# Patient Record
Sex: Female | Born: 1965 | Race: Black or African American | Hispanic: No | Marital: Single | Smoking: Never smoker
Health system: Southern US, Community
[De-identification: ages and names within clinical notes are randomized; demographics above are authoritative.]

## PROBLEM LIST (undated history)

## (undated) DIAGNOSIS — I10 Essential (primary) hypertension: Secondary | ICD-10-CM

## (undated) HISTORY — PX: CYST EXCISION: SHX5701

---

## 2007-06-13 ENCOUNTER — Inpatient Hospital Stay (HOSPITAL_COMMUNITY): Admission: AD | Admit: 2007-06-13 | Discharge: 2007-06-16 | Payer: Self-pay | Admitting: Obstetrics and Gynecology

## 2007-06-14 ENCOUNTER — Encounter (INDEPENDENT_AMBULATORY_CARE_PROVIDER_SITE_OTHER): Payer: Self-pay | Admitting: Obstetrics and Gynecology

## 2009-05-10 IMAGING — CT CT ABDOMEN W/ CM
2 of 5 series · 16 of 46 positions shown, 18 images · IV contrast (agent unspecified)
Comparison: Some done earlier the same date.

CT ABDOMEN

CLINICAL DATA: Right lower quadrant abdominal pain and vomiting.
Abnormal pelvic ultrasound.

CT ABDOMEN AND PELVIS WITHOUT AND WITH CONTRAST
TECHNIQUE: Multidetector CT imaging of the abdomen and pelvis was
performed following the standard protocol before and following the
bolus administration of intravenous contrast.
Contrast: 100 ml 6mnipaque-ACC intravenously.  Oral contrast was
given.

[Series 2: abd/pelv with 5.0 b31f st · axial · 0.66mm/px · z∈[-502,-142]mm · 13 of 82 slices shown, 15 images]
[im 5/82  soft-tissue]
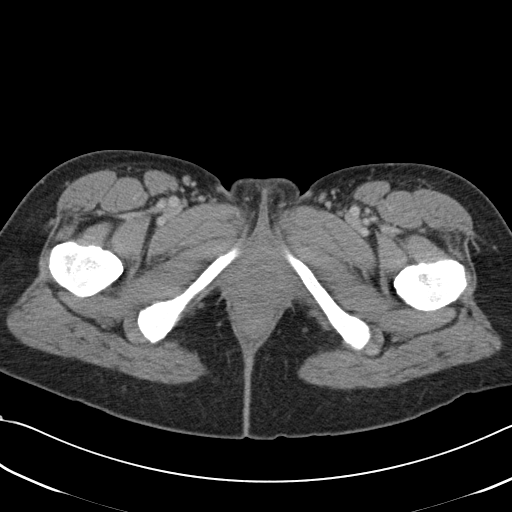
[im 5/82  bone]
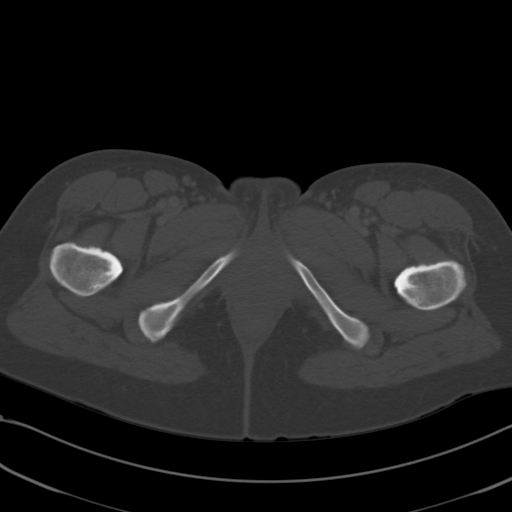
[im 13/82  soft-tissue]
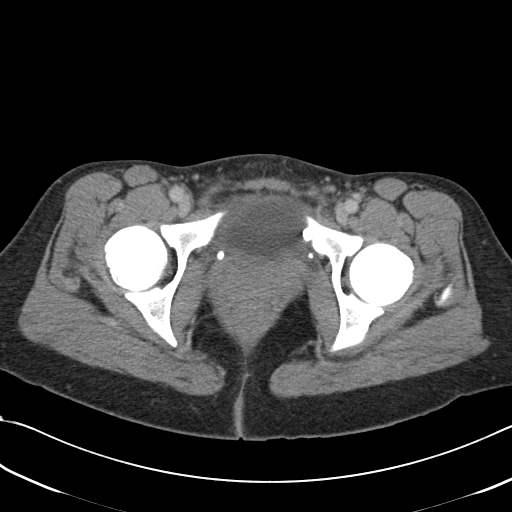
[im 17/82  soft-tissue]
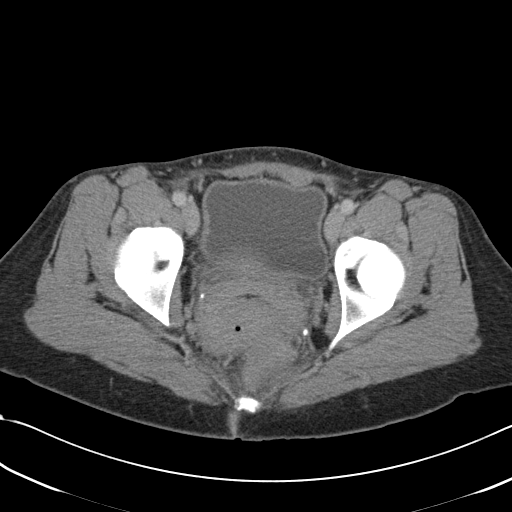
[im 25/82  soft-tissue]
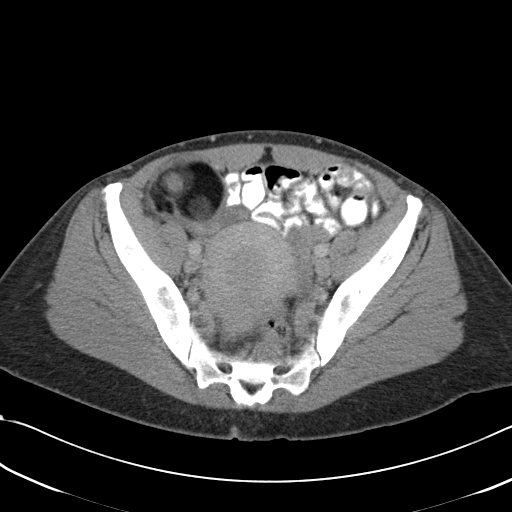
[im 29/82  soft-tissue]
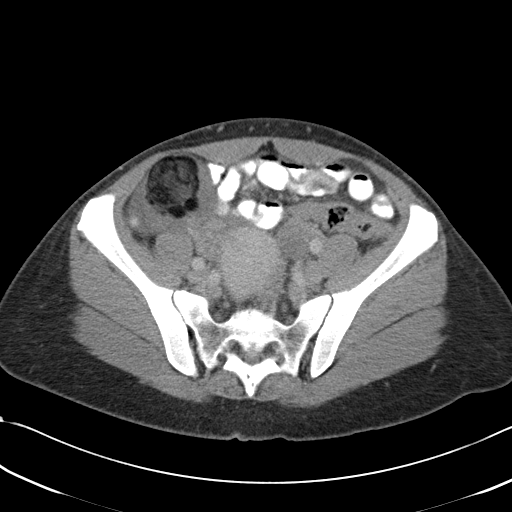
[im 37/82  soft-tissue]
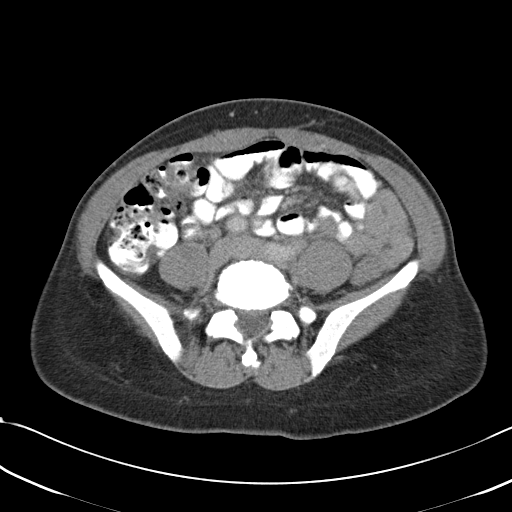
[im 41/82  soft-tissue]
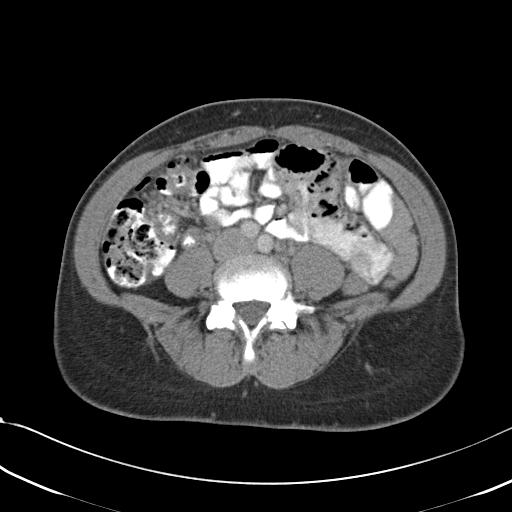
[im 45/82  soft-tissue]
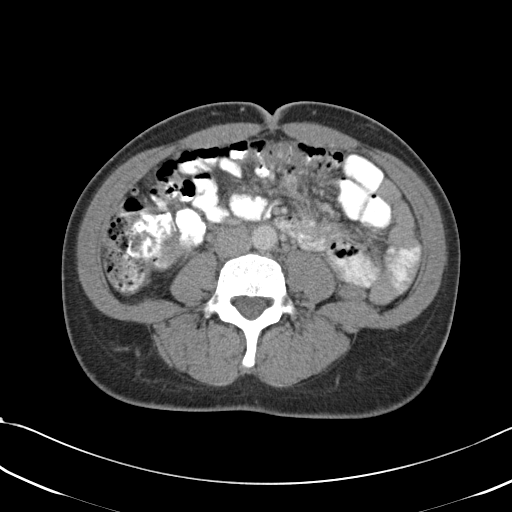
[im 53/82  soft-tissue]
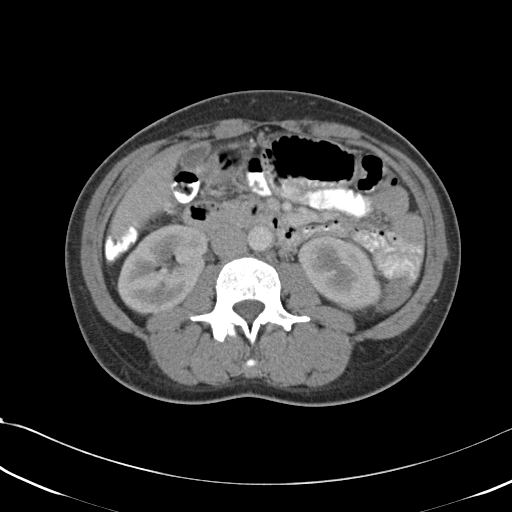
[im 53/82  bone]
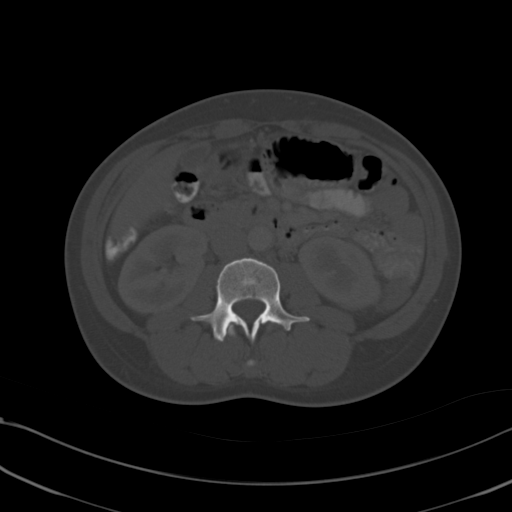
[im 57/82  soft-tissue]
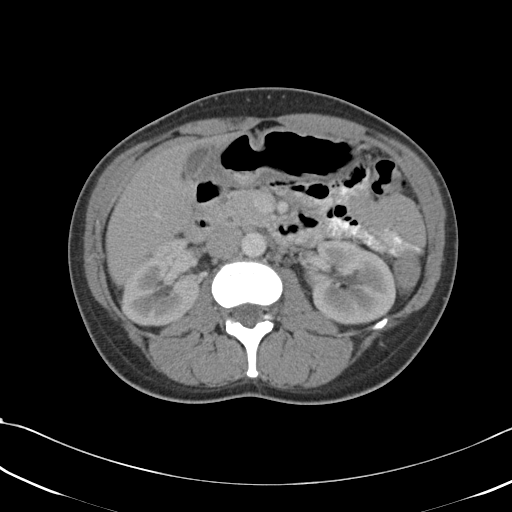
[im 65/82  soft-tissue]
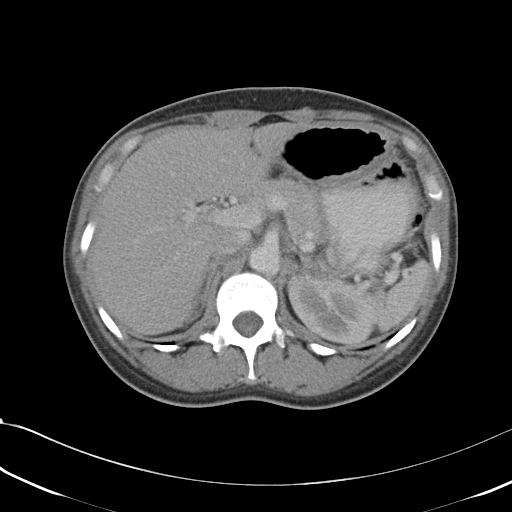
[im 69/82  soft-tissue]
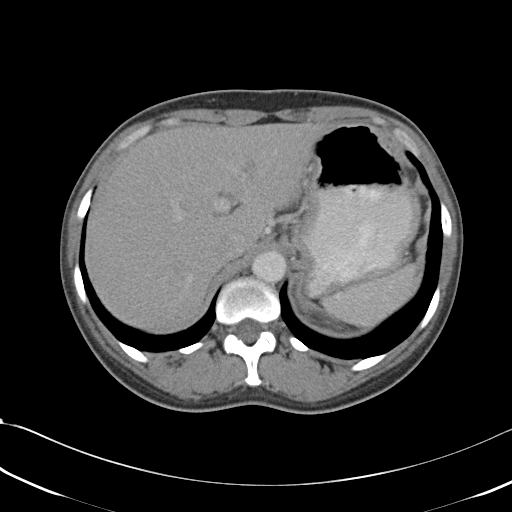
[im 77/82  soft-tissue]
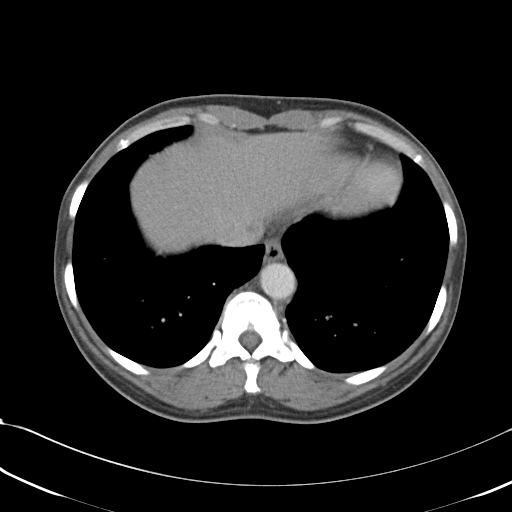

[Series 602: cor · coronal · 0.80mm/px · 3 of 51 slices shown]
[im 17/51  soft-tissue]
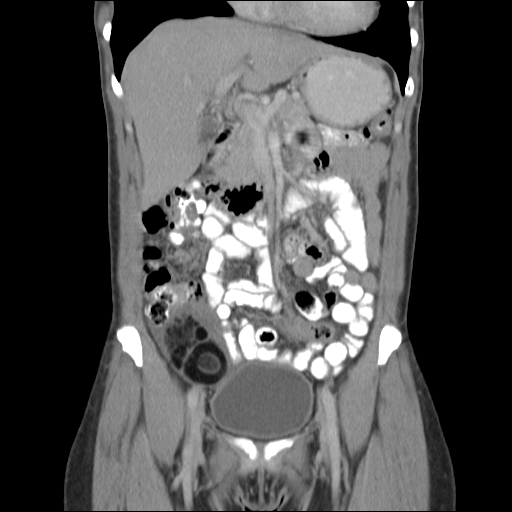
[im 23/51  soft-tissue]
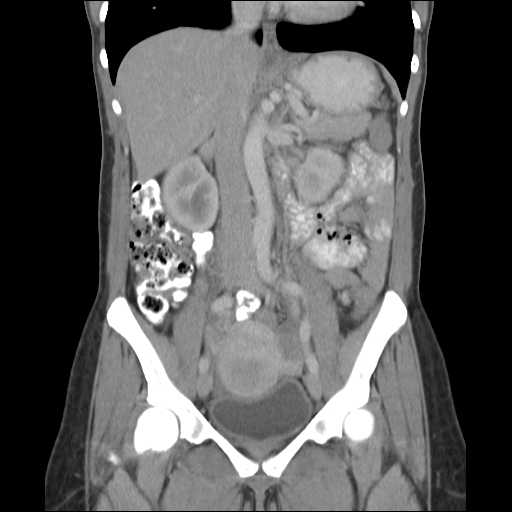
[im 28/51  soft-tissue]
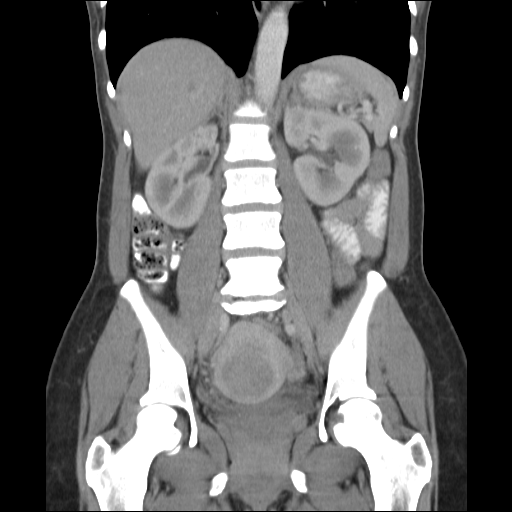

[16 of 46 positions shown; findings below may reference images not displayed]

FINDINGS: The lung bases are clear.  There is no pleural effusion.
The liver, spleen, gallbladder, pancreas, adrenal glands and
kidneys appear normal aside from a probable 6 mm cyst posteriorly
in the mid left kidney.  Gallbladder assessment is limited by
contraction.  No intra-abdominal inflammatory change are
identified.  The bowel gas pattern is normal.
IMPRESSION: No acute abdominal findings.

CT PELVIS
FINDINGS: Corresponding with the heterogeneous mass noted in the
false pelvis on the prior ultrasound is a fatty right adnexal mass
measuring 5.3 x 4.1 cm transverse.  This measures up to 6.4 cm in
diameter on the coronal images.  There are central soft tissue
components, but no calcifications.  There is some soft tissue
stranding in the surrounding fat, and given the patient's acute
symptoms, the possibility of torsion of this ovarian dermoid would
need to be considered.

The appendix is visualized anteriorly and superiorly, well removed
from this process.  The appendix appears normal.

The left ovary appears normal.  Anterior uterine fibroid is noted.
Small phleboliths are noted bilaterally.
IMPRESSION: 1.  Right ovarian dermoid (mature teratoma) measuring up to 6.4 cm
in diameter.  There is mild surrounding soft tissue stranding, and
given the patient's acute symptoms, the possibility of torsion
definitely needs to be considered.  Gynecologic consultation is
recommended.
2.  No evidence of appendicitis.
3.  Uterine fibroid.
4. I discussed the findings by telephone with Dr Zarzour on 06/13/2007
at 2501 hours.

## 2010-02-11 ENCOUNTER — Encounter: Payer: Self-pay | Admitting: Emergency Medicine

## 2010-06-05 NOTE — Op Note (Signed)
[name], [name]                 ACCOUNT NO.:  [id number]   MEDICAL RECORD NO.:  [id number]          PATIENT TYPE:  MAT   LOCATION:  [facility]                          FACILITY:  [facility]   PHYSICIAN:  [name] H. Tenny Craw, MD     DATE OF BIRTH:  August 05, 1965   DATE OF PROCEDURE:  06/14/2007  DATE OF DISCHARGE:                               OPERATIVE REPORT   PREOPERATIVE DIAGNOSES:  1. Right adnexal mass.  2. Suspected right adnexal torsion.   POSTOPERATIVE DIAGNOSES:  1. Right adnexal mass, grossly consistent with ovarian dermoid.  2. No obvious torsion.   PROCEDURES:  1. Exploratory laparotomy.  2. Right salpingo-oophorectomy.   SURGEON:  Freddrick March. Tenny Craw, MD   ASSISTANT:  None.   ANESTHESIA:  General.   OPERATIVE FINDINGS:  Enlarged right ovary approximately 5-6 cm in size.  No evidence of torsion of the ovary was noted at the time of surgery.  Uterus was 8-Ostrosky size with anterior fundal fibroid, approximately 3-  4cm, noted and normal-appearing left ovary and tube.  Small and large  bowel appeared normal and normal appendix.  The right ovary was bisected  after removal and fat and hair were noted within the mass consistent  with dermoid.   SPECIMENS:  Right tube and ovary and pelvic washings.   DISPOSITION:  Specimens to pathology.   ESTIMATED BLOOD LOSS:  200 mL.   COMPLICATIONS:  None.   PROCEDURE:  [name] is a 45 year old G0, who presented to the  emergency room on Jun 13, 2007, complaining of acute onset of right  lower quadrant sharp abdominal pain associated with nausea.  In the  emergency room, she had a pelvic ultrasound performed, which  demonstrated approximately 8-Colclough size uterus with an anterior fundal  fibroid, normal-appearing left ovary; however, the right ovary could not  be visualized on the pelvic portion of the ultrasound.  On the abdominal  portion in the right lower quadrant, there appeared to be approximately  6-cm sized mass with cystic and solid  components.  No obvious Doppler  flow was noted in this mass.  It was unable to be determined whether  this was the adnexa or the appendix and a CT scan was performed, which  demonstrated a normal-appearing appendix, and enlarged right adnexa with  cystic and solid components.  On CT consistent with a right adnexal  dermoid.  Given the patient's pain, there was concern for right adnexal  torsion.  In the emergency room at [facility], the patient was  without pain; however, she had received Dilaudid prior to transport from  [facility] and given the previous findings, the decision was made to  proceed with definitive surgical management for suspected right dermoid  with torsion.  Risks, benefits, and alternatives of the procedure were  discussed with the patient including possible malignancy, possible need  for total abdominal hysterectomy.  The patient understood the risks and  agreed to proceed with the procedure.  Following the appropriate  informed consent, the patient was brought to the operating room where  general anesthesia was administered.  She  was placed in the dorsal  supine position, prepped and draped in the normal sterile fashion.  A  Pfannenstiel skin incision was made with a scalpel and carried down  through the underlying layers of soft tissue to the fascia.  The fascia  was incised in the midline.  The fascial incision was extended laterally  with the Mayo scissors.  Superior aspect of the fascial incision was  grasped with Kocher clamps x2 tented up.  Underlying rectus muscle was  dissected off sharply with the electrocautery unit.  The same procedure  was repeated on the inferior aspect of the fascial incision.  The rectus  muscles were then separated in the midline.  The abdominal peritoneum  was identified, tented up, entered sharply with the Metzenbaum.  This  incision was extended superiorly and inferiorly with good visualization  of the bladder.  The abdomen  and pelvis was then manually explored.  The  uterus was palpably normal except for a palpable fibroid.  The left  adnexa was palpably normal.  The right adnexa was palpably enlarged.  The bowels were palpably normal.  There was no peritoneal studding  noted.  Pelvic washings were obtained at this point.  The Balfour  retractor was then erected.  The large and small bowel were then passed  into the upper abdomen with moist laparotomy sponges.  The bladder blade  was then inserted.  The right adnexa was identified.  The peritoneum  lateral to the infundibulopelvic ligament was then identified, tented  up, and entered sharply with the electrocautery unit.  The incision was  extended superior and lateral to the infundibulopelvic ligament and also  inferiorly and the retroperitoneal space was opened.  The ureter was  identified in the medial leaf of the broad ligament and noted to  peristalsis.  At this point, a window was made in the medial leaf of the  broad ligament inferior to the infundibulopelvic ligament and superior  to the ureter.  Heaney clamps were then used to clamp the  infundibulopelvic ligament x2.  The IP was then cut, free tied, and  suture ligated.  The incision in the medial leaf of the broad ligament  was then further extended inferior to the infundibulopelvic ligament  down to the uterus.  A second Heaney clamp was clamped across the  junction of the fallopian tube and ovarian suspensory ligament and the  uterus a second bowel clamp was placed.  The Mayo scissors were then  used to transect the ovary and tube from its attachment to the uterus  and at this point, the right ovary and tube were removed from the  abdomen.  The pedicle was then free tied and suture ligated and noted to  be hemostatic.  At this point, the infundibulopelvic ligament was  reinspected, and found to be hemostatic.  The uterine pedicle was  inspected and found to be hemostatic.  The ureter was  reinspected on the  medial leaf of the broad ligament and found to peristalsis.  At this  point, the moist laparotomy sponges removed from the upper abdomen.  The  Balfour retractor was removed from the abdominal cavity.  The appendix  was then identified at this time and visualized to be normal.  The  bowels were then returned to the abdominal cavity.  The abdominal  peritoneum was reapproximated with 2-0 Vicryl in a running fashion.  The  rectus muscles were reapproximated with 2-0 Vicryl with two figure-of-8  sutures.  The fascia was  closed with 0 looped PDS in a running fashion  and the skin was closed with staples.  All sponge, lap, and needle  counts were correct x2.  The patient tolerated the procedure well and  was brought to the recovery room in stable condition following the  procedure.      Freddrick March. Tenny Craw, MD  Electronically Signed     KHR/MEDQ  D:  06/14/2007  T:  06/14/2007  Job:  [id number]

## 2010-06-08 NOTE — Discharge Summary (Signed)
[name] [name], [name] [name]                 ACCOUNT NO.:  [id number]   MEDICAL RECORD NO.:  [id number]          PATIENT TYPE:  INP   LOCATION:  9302                          FACILITY:  [facility]   PHYSICIAN:  Kendra H. Tenny Craw, MD     DATE OF BIRTH:  May 02, 1965   DATE OF ADMISSION:  06/13/2007  DATE OF DISCHARGE:  06/16/2007                               DISCHARGE SUMMARY   HOSPITAL DIAGNOSES:  1. Right ovarian torsion.  2. Hypertensive urgency.   HOSPITAL COURSE:  [name] is a 45 year old African American female who  presented to the emergency room on Jun 13, 2007 with acute onset of  right lower quadrant pain.  A CT scan of the abdomen and pelvis  demonstrated a right adnexal mass consistent with a dermoid or mature  cystic teratoma.  She was noted to be hypertensive at that time, however  this was attributed to her discomfort.  Later on that day, she was  transferred to [facility] and underwent an exploratory laparotomy  with a right salpingo-oophorectomy for a right mature cystic teratoma.  No evidence of torsion could be found at the time of surgery.  Postoperatively, because of significant hypertension requiring IV  antihypertensives to control her blood pressures.  The patient was  admitted to the AICU.  On postoperative day zero, the patient was  changed to oral labetalol.  IV labetalol was stopped.  She was  transferred to the regular postoperative floor and on postoperative day  #2, she was felt stable for discharge after having adequate control of  pain and blood pressure, ambulating well and tolerating a regular diet.  She was advised to follow up for postoperative evaluation 2 Mastandrea after  discharged and I was also advised to obtain a primary care physician for  management of hypertension.      Freddrick March. Tenny Craw, MD  Electronically Signed     KHR/MEDQ  D:  10/04/2007  T:  10/05/2007  Job:  [id number]

## 2010-10-17 LAB — CBC
HCT: 33.1 — ABNORMAL LOW
HCT: 37.4
Hemoglobin: 11.5 — ABNORMAL LOW
Hemoglobin: 12.5
MCHC: 33.5
MCHC: 34.8
MCV: 89.9
MCV: 90.1
Platelets: 215
Platelets: 239
RBC: 3.68 — ABNORMAL LOW
RBC: 4.15
RDW: 14.4
RDW: 14.4
WBC: 13.6 — ABNORMAL HIGH
WBC: 13.6 — ABNORMAL HIGH

## 2010-10-17 LAB — BASIC METABOLIC PANEL
BUN: 8
CO2: 23
Calcium: 8.9
Chloride: 106
Creatinine, Ser: 0.81
GFR calc Af Amer: >60
GFR calc non Af Amer: >60
Glucose, Bld: 162 — ABNORMAL HIGH
Potassium: 4
Sodium: 135

## 2010-10-17 LAB — URINALYSIS, ROUTINE W REFLEX MICROSCOPIC
Hgb urine dipstick: NEGATIVE
Nitrite: NEGATIVE
Protein, ur: NEGATIVE
Specific Gravity, Urine: 1.01
Urobilinogen, UA: 0.2

## 2010-10-17 LAB — GC/CHLAMYDIA PROBE AMP, GENITAL
Chlamydia, DNA Probe: NEGATIVE
GC Probe Amp, Genital: NEGATIVE

## 2010-10-17 LAB — DIFFERENTIAL
Basophils Absolute: 0
Basophils Relative: 0
Eosinophils Absolute: 0
Eosinophils Relative: 0
Lymphocytes Relative: 13
Lymphs Abs: 1.7
Monocytes Absolute: 0.7
Monocytes Relative: 5
Neutro Abs: 11.1 — ABNORMAL HIGH
Neutrophils Relative %: 81 — ABNORMAL HIGH

## 2010-10-17 LAB — COMPREHENSIVE METABOLIC PANEL
Albumin: 3.8
BUN: 12
Calcium: 9
Glucose, Bld: 118 — ABNORMAL HIGH
Total Protein: 7.5

## 2010-10-17 LAB — TYPE AND SCREEN
ABO/RH(D): O POS
Antibody Screen: NEGATIVE

## 2010-10-17 LAB — PREGNANCY, URINE: Preg Test, Ur: NEGATIVE

## 2010-10-17 LAB — PROTIME-INR
INR: 1.1
Prothrombin Time: 14.1

## 2010-10-17 LAB — WET PREP, GENITAL
Trich, Wet Prep: NONE SEEN
Yeast Wet Prep HPF POC: NONE SEEN

## 2010-10-17 LAB — LIPASE, BLOOD: Lipase: 15

## 2010-10-17 LAB — ABO/RH: ABO/RH(D): O POS

## 2010-10-17 LAB — FIBRINOGEN: Fibrinogen: 329

## 2010-10-17 LAB — APTT: aPTT: 34

## 2015-05-19 ENCOUNTER — Encounter (HOSPITAL_COMMUNITY): Payer: Self-pay | Admitting: Emergency Medicine

## 2015-05-19 ENCOUNTER — Ambulatory Visit (HOSPITAL_COMMUNITY)
Admission: EM | Admit: 2015-05-19 | Discharge: 2015-05-19 | Disposition: A | Discharge status: Home / Self Care | Payer: Self-pay | Attending: Internal Medicine | Admitting: Internal Medicine

## 2015-05-19 DIAGNOSIS — B029 Zoster without complications: Secondary | ICD-10-CM

## 2015-05-19 HISTORY — DX: Essential (primary) hypertension: I10

## 2015-05-19 MED ORDER — ACYCLOVIR 800 MG PO TABS: 800.0000 mg | ORAL_TABLET | Freq: Every day | ORAL | Status: AC

## 2015-05-19 MED ORDER — PREDNISONE 50 MG PO TABS: 50.0000 mg | ORAL_TABLET | Freq: Every day | ORAL | Status: AC

## 2015-05-19 MED ORDER — TRAMADOL HCL 50 MG PO TABS: 50.0000 mg | ORAL_TABLET | Freq: Four times a day (QID) | ORAL | Status: AC | PRN

## 2015-05-19 NOTE — Discharge Instructions (Signed)
Prescriptions for acyclovir (for shingles), prednisone (to help with pain), tramadol (for pain).  Recheck for marked worsening of pain.  Anticipate gradual improvement in rash/pain over the next 2-3 Oliveria.    Shingles Shingles is an infection that causes a painful skin rash and fluid-filled blisters. Shingles is caused by the same virus that causes chickenpox. Shingles only develops in people who:  Have had chickenpox.  Have gotten the chickenpox vaccine. (This is rare.) The first symptoms of shingles may be itching, tingling, or pain in an area on your skin. A rash will follow in a few days or Gasser. The rash is usually on one side of the body in a bandlike or beltlike pattern. Over time, the rash turns into fluid-filled blisters that break open, scab over, and dry up. Medicines may:  Help you manage pain.  Help you recover more quickly.  Help to prevent long-term problems. HOME CARE Medicines  Take medicines only as told by your doctor.  Apply an anti-itch or numbing cream to the affected area as told by your doctor. Blister and Rash Care  Take a cool bath or put cool compresses on the area of the rash or blisters as told by your doctor. This may help with pain and itching.  Keep your rash covered with a loose bandage (dressing). Wear loose-fitting clothing.  Keep your rash and blisters clean with mild soap and cool water or as told by your doctor.  Check your rash every day for signs of infection. These include redness, swelling, and pain that lasts or gets worse.  Do not pick your blisters.  Do not scratch your rash. General Instructions  Rest as told by your doctor.  Keep all follow-up visits as told by your doctor. This is important.  Until your blisters scab over, your infection can cause chickenpox in people who have never had it or been vaccinated against it. To prevent this from happening, avoid touching other people or being around other people,  especially:  Babies.  Pregnant women.  Children who have eczema.  Elderly people who have transplants.  People who have chronic illnesses, such as leukemia or AIDS. GET HELP IF:  Your pain does not get better with medicine.  Your pain does not get better after the rash heals.  Your rash looks infected. Signs of infection include:  Redness.  Swelling.  Pain that lasts or gets worse. GET HELP RIGHT AWAY IF:  The rash is on your face or nose.  You have pain in your face, pain around your eye area, or loss of feeling on one side of your face.  You have ear pain or you have ringing in your ear.  You have loss of taste.  Your condition gets worse.   This information is not intended to replace advice given to you by your health care provider. Make sure you discuss any questions you have with your health care provider.   Document Released: 06/26/2007 Document Revised: 01/28/2014 Document Reviewed: 10/19/2013 Elsevier Interactive Patient Education [employer]2016 [employer] Inc.

## 2015-05-19 NOTE — ED Notes (Signed)
Patient reports back pain started Sunday 05/14/15.  Patient reports rash appeared 3 days ago. Patient has a rash around left torso, bra-line

## 2015-05-19 NOTE — ED Provider Notes (Signed)
CSN: [id number]     Arrival date & time 05/19/15  1309 History   First MD Initiated Contact with Patient 05/19/15 1355     Chief Complaint  Patient presents with  . Rash  . Back Pain   HPI   50yo lady with 3d hx sore red rash L mid back/L breast and chest.  Pain in L back/chest started about a week ago. Malaise.  Taking tylenol with some relief.  No fever. No cough.  Past Medical History  Diagnosis Date  . Hypertension    Past Surgical History  Procedure Laterality Date  . Cyst excision      Social History  Substance Use Topics  . Smoking status: Never Smoker   . Smokeless tobacco: None  . Alcohol Use: No    Review of Systems  All other systems reviewed and are negative.   Allergies  Review of patient's allergies indicates no known allergies.  Home Medications   Prior to Admission medications   Medication Sig Start Date End Date Taking? Authorizing Provider  acetaminophen (TYLENOL) 325 MG tablet Take 650 mg by mouth every 6 (six) hours as needed.   Yes Historical Provider, MD  acyclovir (ZOVIRAX) 800 MG tablet Take 1 tablet (800 mg total) by mouth 5 (five) times daily. 05/19/15 05/28/15  Eustace Moore, MD  predniSONE (DELTASONE) 50 MG tablet Take 1 tablet (50 mg total) by mouth daily. 05/19/15 05/24/15  Eustace Moore, MD  traMADol (ULTRAM) 50 MG tablet Take 1 tablet (50 mg total) by mouth every 6 (six) hours as needed. 05/19/15   Eustace Moore, MD      BP 147/96 mmHg  Pulse 81  Temp(Src) 98.6 F (37 C) (Oral)  Resp 22  SpO2 100%  LMP 04/21/2015  Physical Exam  Constitutional: She is oriented to person, place, and time. No distress.  Alert, nicely groomed  HENT:  Head: Atraumatic.  Eyes:  Conjugate gaze, no eye redness/drainage  Neck: Neck supple.  Cardiovascular: Normal rate and regular rhythm.   Pulmonary/Chest: No respiratory distress.  Lungs clear, symmetric breath sounds  Abdominal: She exhibits no distension.  Musculoskeletal: Normal range of  motion.  No leg swelling  Neurological: She is alert and oriented to person, place, and time.  Skin: Skin is warm and dry.  No cyanosis L midback and L breast/chest extensively involved with clustered vesicles on red base, large patches.  Minimal crusting.    Nursing note and vitals reviewed.   ED Course  Procedures (including critical care time)  none  MDM   1. Herpes zoster    Meds ordered this encounter  Medications  . acyclovir (ZOVIRAX) 800 MG tablet    Sig: Take 1 tablet (800 mg total) by mouth 5 (five) times daily.    Dispense:  40 tablet    Refill:  0  . predniSONE (DELTASONE) 50 MG tablet    Sig: Take 1 tablet (50 mg total) by mouth daily.    Dispense:  5 tablet    Refill:  0  . traMADol (ULTRAM) 50 MG tablet    Sig: Take 1 tablet (50 mg total) by mouth every 6 (six) hours as needed.    Dispense:  30 tablet    Refill:  0   Anticipate gradual improvement in rash/discomfort over the next 2-3 Molloy.  Recheck for further evaluation if pain increases markedly or if not starting to improve as expected, or for new fever >100.5.      [name] [name]  Dayton ScrapeMurray, MD 05/21/15 [phone]
